# Patient Record
Sex: Female | Born: 1945 | Race: White | Hispanic: No | Marital: Single | Smoking: Never smoker
Health system: Southern US, Community
[De-identification: ages and names within clinical notes are randomized; demographics above are authoritative.]

---

## 2006-08-29 ENCOUNTER — Inpatient Hospital Stay (HOSPITAL_COMMUNITY): Admission: RE | Admit: 2006-08-29 | Discharge: 2006-08-30 | Payer: Self-pay | Admitting: Orthopedic Surgery

## 2007-05-09 IMAGING — CR DG CHEST 2V
2 series · 2 of 2 positions shown · non-contrast
Comparison: None.

CLINICAL DATA: Preadmission chest x-ray for posterior tibial tendon
insufficiency.

[view not recorded (1 of 2)]
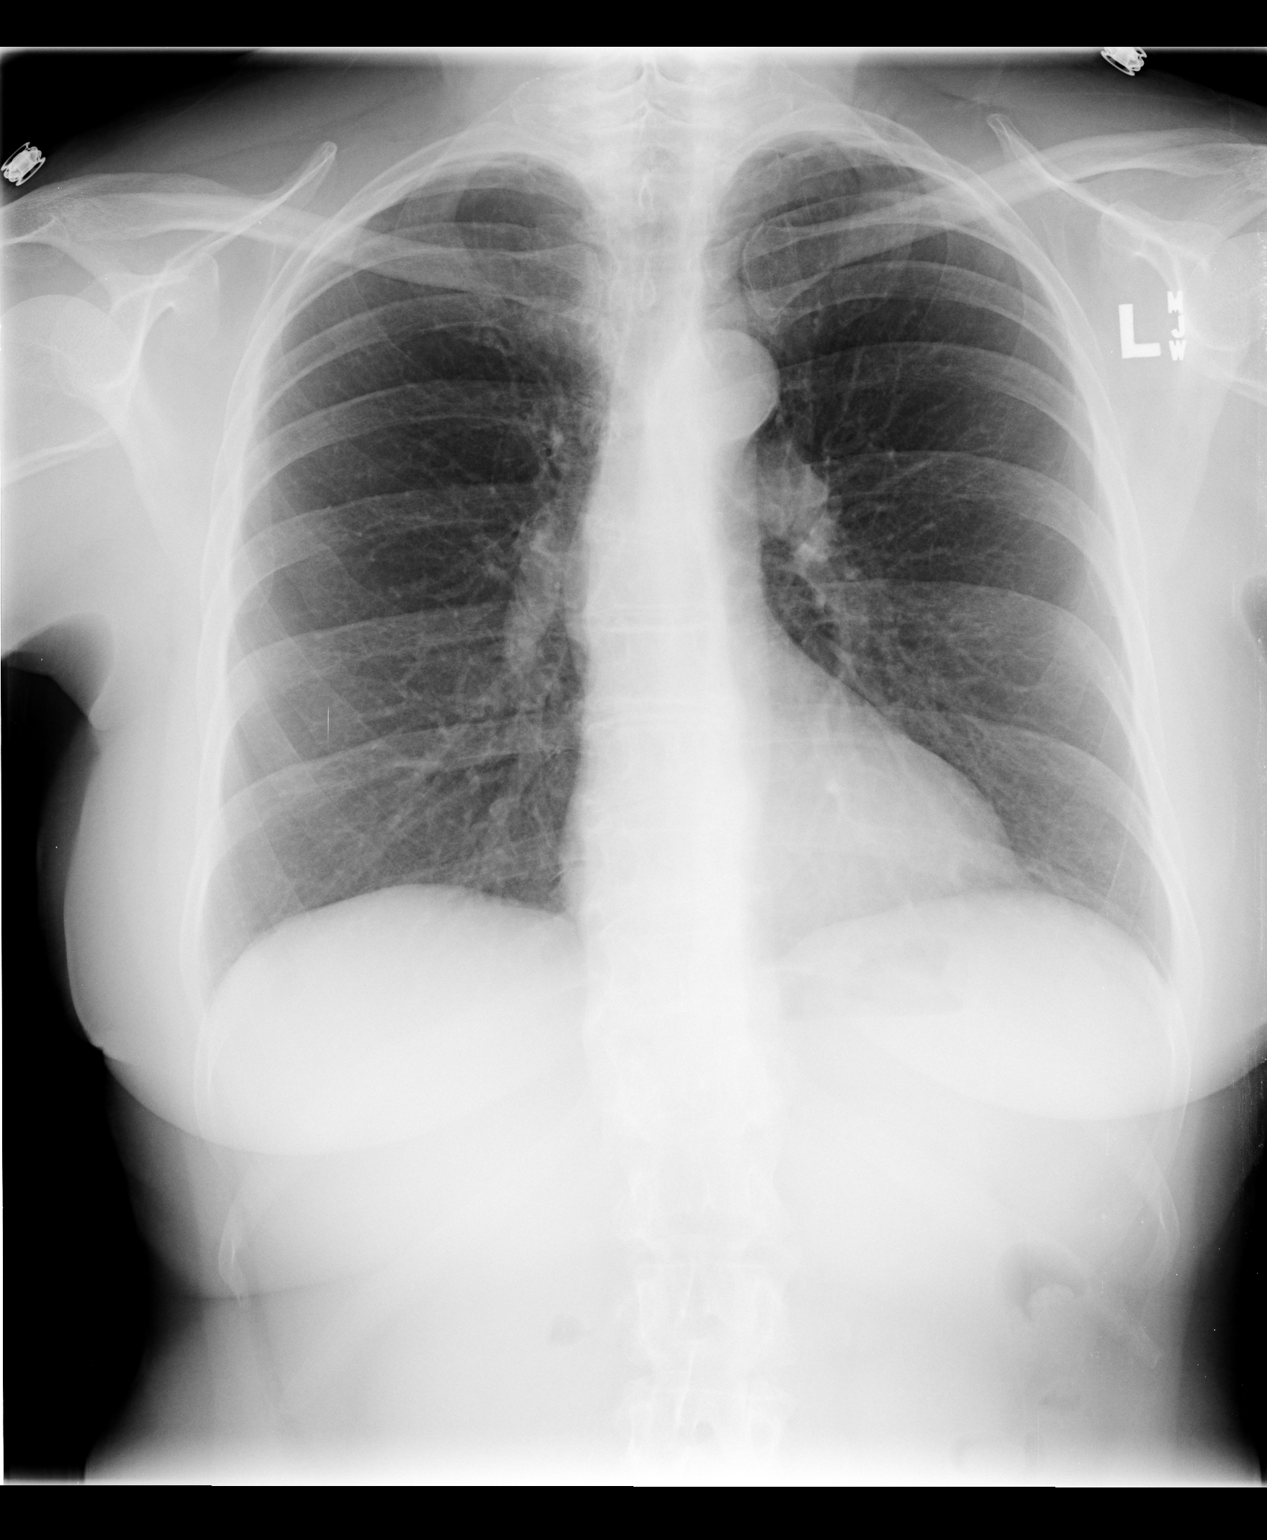

[view not recorded (2 of 2)]
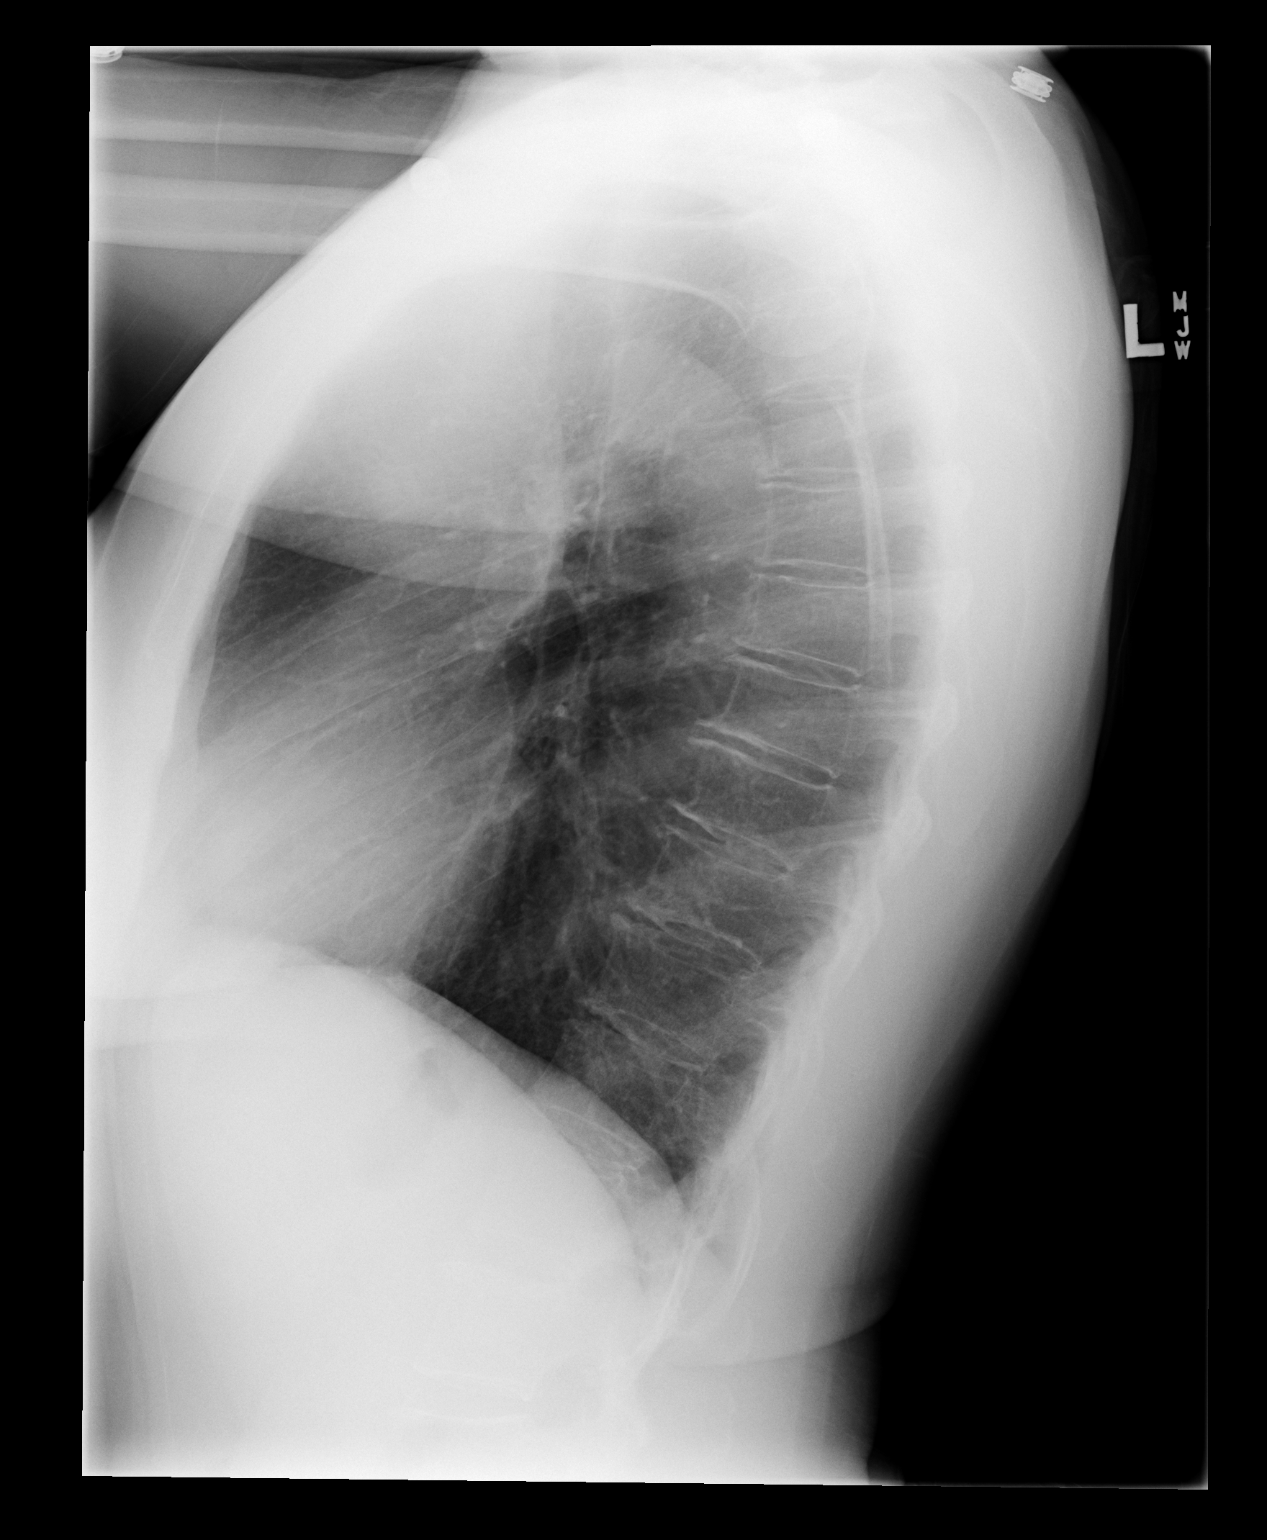

[2 of 2 positions shown; findings below may reference images not displayed]

CHEST - 2 VIEW:

The lungs are clear.  The cardiopericardial silhouette is within normal limits
for size.  Visualized bony structures of the thorax are intact.
IMPRESSION: No acute cardiopulmonary process

## 2016-10-01 ENCOUNTER — Encounter: Payer: Self-pay | Admitting: Podiatry

## 2016-10-01 ENCOUNTER — Ambulatory Visit (INDEPENDENT_AMBULATORY_CARE_PROVIDER_SITE_OTHER): Payer: Medicare Other | Admitting: Podiatry

## 2016-10-01 VITALS — BP 120/66 | HR 74 | Resp 14

## 2016-10-01 DIAGNOSIS — M79674 Pain in right toe(s): Secondary | ICD-10-CM

## 2016-10-01 DIAGNOSIS — L608 Other nail disorders: Secondary | ICD-10-CM | POA: Diagnosis not present

## 2016-10-01 DIAGNOSIS — B351 Tinea unguium: Secondary | ICD-10-CM | POA: Diagnosis not present

## 2016-10-01 NOTE — Progress Notes (Signed)
   Subjective:    Patient ID: Kristen MaximLinda Gillespie, female    DOB: 1946/06/14, 70 y.o.   MRN: [id number]  HPI this patient presents the office with chief complaint of a painful big toe, right foot. Patient states that this toe is very painful, especially when it is stepped on as it was last week. This patient states that she has developed a pincer toenail and was seen by Dr. Mayford KnifeWilliams the dermatologist who referred her to this office. She says she never made the appointment until her toe  stepped on last week.  She denies any drainage or infection from the toe. She presents the office today for an evaluation and treatment of this condition  Review of Systems  All other systems reviewed and are negative.      Objective:   Physical Exam GENERAL APPEARANCE: Alert, conversant. Appropriately groomed. No acute distress.  VASCULAR: Pedal pulses are  palpable at  [facility] bilateral.  Capillary refill time is immediate to all digits,  Normal temperature gradient.  Digital hair growth is present bilateral  NEUROLOGIC: sensation is normal to 5.07 monofilament at 5/5 sites bilateral.  Light touch is intact bilateral, Muscle strength normal.  MUSCULOSKELETAL: acceptable muscle strength, tone and stability bilateral.  Intrinsic muscluature intact bilateral.  Rectus appearance of foot and digits noted bilateral.   DERMATOLOGIC: skin color, texture, and turgor are within normal limits.  No preulcerative lesions or ulcers  are seen, no interdigital maceration noted.  No open lesions present.   No drainage noted.  Thick mycotic pincer toenail right hallux.        Assessment & Plan:  Onychomycosis  Pincer toenail   IE  Debridement of nail.  Discussed this structural problem with this patient and it was decided to perform conservative treatment and have the patient return every 3 months for continued preventative foot care services   Helane GuntherGregory Mayer DPM

## 2016-12-12 ENCOUNTER — Ambulatory Visit (INDEPENDENT_AMBULATORY_CARE_PROVIDER_SITE_OTHER): Payer: Medicare Other | Admitting: Sports Medicine

## 2016-12-12 ENCOUNTER — Encounter: Payer: Self-pay | Admitting: Sports Medicine

## 2016-12-12 DIAGNOSIS — M79609 Pain in unspecified limb: Secondary | ICD-10-CM | POA: Diagnosis not present

## 2016-12-12 DIAGNOSIS — L608 Other nail disorders: Secondary | ICD-10-CM | POA: Diagnosis not present

## 2016-12-12 DIAGNOSIS — B351 Tinea unguium: Secondary | ICD-10-CM | POA: Diagnosis not present

## 2016-12-12 NOTE — Progress Notes (Signed)
Subjective: Kristen Gillespie is a 71 y.o. female patient seen today in office with complaint of painful thickened and elongated toenails; unable to trim. Patient states big toenails ingrown and want to discuss procedure. Patient has no other pedal complaints at this time.   There are no active problems to display for this patient.   Current Outpatient Prescriptions on File Prior to Visit  Medication Sig Dispense Refill  . aspirin (GOODSENSE ASPIRIN) 325 MG tablet Take 325 mg by mouth.    . doxycycline (PERIOSTAT) 20 MG tablet     . hydrochlorothiazide (HYDRODIURIL) 50 MG tablet     . metoprolol succinate (TOPROL-XL) 25 MG 24 hr tablet     . potassium chloride SA (K-DUR,KLOR-CON) 20 MEQ tablet     . pravastatin (PRAVACHOL) 40 MG tablet Take 40 mg by mouth.    . venlafaxine XR (EFFEXOR-XR) 37.5 MG 24 hr capsule      No current facility-administered medications on file prior to visit.     Allergies  Allergen Reactions  . Amoxicillin Anaphylaxis  . Hydrocodone Swelling  . Morphine Shortness Of Breath  . Oxycodone-Acetaminophen Shortness Of Breath  . Estrogens     Prolongs QT  . Other     Prolongs QT    Objective: Physical Exam  General: Well developed, nourished, no acute distress, awake, alert and oriented x 3  Vascular: Dorsalis pedis artery 1/4 bilateral, Posterior tibial artery 1/4 very faint bilateral, skin temperature warm to cool proximal to distal bilateral lower extremities, + varicosities with purple hue to toes, no pedal hair present bilateral.  Neurological: Gross sensation present via light touch bilateral.   Dermatological: Skin is warm, dry, and supple bilateral, Bilateral hallux nails are mildly elongated, incurvated, thick, and discolored with mild subungal debris, no webspace macerations present bilateral, no open lesions present bilateral, no callus/corns/hyperkeratotic tissue present bilateral. No signs of infection bilateral.  Musculoskeletal: No symptomatic  boney deformities noted bilateral. Muscular strength within normal limits without painon range of motion. No pain with calf compression bilateral.  Assessment and Plan:  Problem List Items Addressed This Visit    None    Visit Diagnoses    Pain due to onychomycosis of nail    -  Primary   Pincer nail deformity         -Examined patient.  -Discussed treatment options for painful mycotic nails pincer hallux nails. -Do not recommend nail procedure until after vascular testing; Patient want to hold off on this for nail  -Mechanically reduced mycotic nails with dremel nail file without incident. -Patient to return in 3 months for follow up evaluation/nail trim or sooner if symptoms worsen.  Asencion Islamitorya Lason Eveland, DPM

## 2016-12-24 ENCOUNTER — Ambulatory Visit: Payer: Medicare Other | Admitting: Podiatry

## 2016-12-27 ENCOUNTER — Ambulatory Visit: Payer: Medicare Other | Admitting: Sports Medicine

## 2017-02-20 ENCOUNTER — Ambulatory Visit: Payer: Medicare Other | Admitting: Sports Medicine
